# Patient Record
Sex: Female | Born: 1975 | Hispanic: Yes | Smoking: Former smoker
Health system: Southern US, Community
[De-identification: ages and names within clinical notes are randomized; demographics above are authoritative.]

## PROBLEM LIST (undated history)

## (undated) DIAGNOSIS — G43829 Menstrual migraine, not intractable, without status migrainosus: Secondary | ICD-10-CM

## (undated) DIAGNOSIS — N6019 Diffuse cystic mastopathy of unspecified breast: Secondary | ICD-10-CM

## (undated) DIAGNOSIS — F341 Dysthymic disorder: Secondary | ICD-10-CM

## (undated) DIAGNOSIS — Z30430 Encounter for insertion of intrauterine contraceptive device: Secondary | ICD-10-CM

## (undated) DIAGNOSIS — E559 Vitamin D deficiency, unspecified: Secondary | ICD-10-CM

## (undated) DIAGNOSIS — E039 Hypothyroidism, unspecified: Secondary | ICD-10-CM

## (undated) DIAGNOSIS — IMO0001 Reserved for inherently not codable concepts without codable children: Secondary | ICD-10-CM

## (undated) DIAGNOSIS — R635 Abnormal weight gain: Secondary | ICD-10-CM

## (undated) DIAGNOSIS — K5900 Constipation, unspecified: Secondary | ICD-10-CM

## (undated) DIAGNOSIS — R0602 Shortness of breath: Secondary | ICD-10-CM

## (undated) DIAGNOSIS — K219 Gastro-esophageal reflux disease without esophagitis: Secondary | ICD-10-CM

## (undated) DIAGNOSIS — J309 Allergic rhinitis, unspecified: Secondary | ICD-10-CM

## (undated) HISTORY — DX: Encounter for insertion of intrauterine contraceptive device: Z30.430

## (undated) HISTORY — DX: Allergic rhinitis, unspecified: J30.9

## (undated) HISTORY — DX: Hypothyroidism, unspecified: E03.9

## (undated) HISTORY — DX: Menstrual migraine, not intractable, without status migrainosus: G43.829

## (undated) HISTORY — DX: Vitamin D deficiency, unspecified: E55.9

## (undated) HISTORY — DX: Reserved for inherently not codable concepts without codable children: IMO0001

## (undated) HISTORY — DX: Dysthymic disorder: F34.1

## (undated) HISTORY — DX: Constipation, unspecified: K59.00

## (undated) HISTORY — DX: Abnormal weight gain: R63.5

## (undated) HISTORY — DX: Diffuse cystic mastopathy of unspecified breast: N60.19

## (undated) HISTORY — DX: Gastro-esophageal reflux disease without esophagitis: K21.9

## (undated) HISTORY — DX: Shortness of breath: R06.02

---

## 2012-01-15 DIAGNOSIS — J309 Allergic rhinitis, unspecified: Secondary | ICD-10-CM | POA: Insufficient documentation

## 2012-01-15 DIAGNOSIS — R0602 Shortness of breath: Secondary | ICD-10-CM

## 2012-01-15 DIAGNOSIS — R635 Abnormal weight gain: Secondary | ICD-10-CM

## 2012-01-15 DIAGNOSIS — G43829 Menstrual migraine, not intractable, without status migrainosus: Secondary | ICD-10-CM | POA: Insufficient documentation

## 2012-01-15 HISTORY — DX: Abnormal weight gain: R63.5

## 2012-01-15 HISTORY — DX: Shortness of breath: R06.02

## 2012-03-05 DIAGNOSIS — K5900 Constipation, unspecified: Secondary | ICD-10-CM

## 2012-03-05 HISTORY — DX: Constipation, unspecified: K59.00

## 2012-03-09 DIAGNOSIS — E559 Vitamin D deficiency, unspecified: Secondary | ICD-10-CM

## 2012-03-09 DIAGNOSIS — E034 Atrophy of thyroid (acquired): Secondary | ICD-10-CM | POA: Insufficient documentation

## 2012-03-09 HISTORY — DX: Vitamin D deficiency, unspecified: E55.9

## 2012-12-07 DIAGNOSIS — Z3009 Encounter for other general counseling and advice on contraception: Secondary | ICD-10-CM | POA: Insufficient documentation

## 2013-03-08 DIAGNOSIS — N6019 Diffuse cystic mastopathy of unspecified breast: Secondary | ICD-10-CM | POA: Insufficient documentation

## 2013-06-23 DIAGNOSIS — K219 Gastro-esophageal reflux disease without esophagitis: Secondary | ICD-10-CM | POA: Insufficient documentation

## 2014-10-12 DIAGNOSIS — F341 Dysthymic disorder: Secondary | ICD-10-CM | POA: Insufficient documentation

## 2014-11-03 HISTORY — PX: INTRAUTERINE DEVICE INSERTION: SHX323

## 2015-06-18 DIAGNOSIS — Z975 Presence of (intrauterine) contraceptive device: Secondary | ICD-10-CM | POA: Insufficient documentation

## 2015-08-08 ENCOUNTER — Ambulatory Visit: Payer: BLUE CROSS/BLUE SHIELD | Attending: Otolaryngology

## 2015-08-08 DIAGNOSIS — G4733 Obstructive sleep apnea (adult) (pediatric): Secondary | ICD-10-CM | POA: Diagnosis not present

## 2015-09-11 ENCOUNTER — Other Ambulatory Visit: Payer: Self-pay | Admitting: Bariatrics

## 2015-09-18 ENCOUNTER — Ambulatory Visit
Admission: RE | Admit: 2015-09-18 | Discharge: 2015-09-18 | Disposition: A | Discharge status: Home / Self Care | Payer: BLUE CROSS/BLUE SHIELD | Source: Ambulatory Visit | Attending: Bariatrics | Admitting: Bariatrics

## 2015-09-18 DIAGNOSIS — Z01818 Encounter for other preprocedural examination: Secondary | ICD-10-CM | POA: Insufficient documentation

## 2015-09-18 DIAGNOSIS — Z01810 Encounter for preprocedural cardiovascular examination: Secondary | ICD-10-CM | POA: Insufficient documentation

## 2015-10-03 ENCOUNTER — Ambulatory Visit: Payer: BLUE CROSS/BLUE SHIELD | Attending: Otolaryngology

## 2015-10-03 DIAGNOSIS — G4733 Obstructive sleep apnea (adult) (pediatric): Secondary | ICD-10-CM | POA: Diagnosis not present

## 2015-12-04 ENCOUNTER — Other Ambulatory Visit: Payer: Self-pay

## 2015-12-11 ENCOUNTER — Ambulatory Visit (INDEPENDENT_AMBULATORY_CARE_PROVIDER_SITE_OTHER): Payer: BLUE CROSS/BLUE SHIELD | Admitting: Cardiovascular Disease

## 2015-12-11 ENCOUNTER — Encounter: Payer: Self-pay | Admitting: Cardiovascular Disease

## 2015-12-11 ENCOUNTER — Encounter: Payer: Self-pay | Admitting: Nurse Practitioner

## 2015-12-11 VITALS — BP 102/60 | HR 78 | Ht 63.0 in | Wt 223.4 lb

## 2015-12-11 DIAGNOSIS — Z01818 Encounter for other preprocedural examination: Secondary | ICD-10-CM

## 2015-12-11 NOTE — Progress Notes (Signed)
Cardiology Office Note   Date:  12/11/2015   ID:  [place], DOB 1976-02-05, MRN [id number]  PCP:  Willow Ora, MD  Cardiologist:   Vesta Mixer, MD   Chief Complaint  Patient presents with  . Pre-op Exam   Problem List 1. Morbid obesity 2. Obstructive sleep apnea  3. Hypothyroidism     History of Present Illness: Rebecca Mclaughlin is a 40 y.o. female who presents for pre op evaluation prior to bariatric surgery   Healthy female, Works out regularly .   No CP or dyspnea. Can climb 3-4 flights of stairs without any problems Was running until 2-3 months ago - would walk 2 miles, run 2 miles.  Was working out regularly.  Was working out 6 times a week,  Now is working out 3 times a week.   Gained 25 lbs. Diet has been good -cooks all her food.     Past Medical History  Diagnosis Date  . Encounter for insertion of mirena IUD   . Dysthymia   . GERD (gastroesophageal reflux disease)   . Fibrocystic breast   . Hypothyroidism   . Allergic rhinitis   . Obesity, Class II, BMI 35.0-39.9, with comorbidity (see actual BMI) (HCC)   . Menstrual headache   . Vitamin D deficiency 5.7.13    resolved 2.4.14  . Constipation 5.3.13    2.4.14 resolved  . SOB (shortness of breath) 3.14.13    resolved 7.11.13  . Weight gain 3.14.13    resolved 7.11.13    Past Surgical History  Procedure Laterality Date  . Intrauterine device insertion  2016     Current Outpatient Prescriptions  Medication Sig Dispense Refill  . FLUoxetine (PROZAC) 20 MG capsule Take 20 mg by mouth daily.     . pantoprazole (PROTONIX) 40 MG tablet Take 40 mg by mouth daily.     Marland Kitchen albuterol (PROVENTIL HFA;VENTOLIN HFA) 108 (90 Base) MCG/ACT inhaler Inhale 2 puffs into the lungs every 4 (four) hours as needed for wheezing.     Marland Kitchen levothyroxine (SYNTHROID, LEVOTHROID) 100 MCG tablet TAKE ONE TABLET BY MOUTH ONCE DAILY    . montelukast (SINGULAIR) 10 MG tablet Take 10 mg by mouth at bedtime.     Marland Kitchen  omeprazole (PRILOSEC OTC) 20 MG tablet Take 20 mg by mouth daily.     Marland Kitchen omeprazole (PRILOSEC) 20 MG capsule Take 20 mg by mouth daily.    . polyethylene glycol (MIRALAX / GLYCOLAX) packet Take 17 g by mouth daily.      No current facility-administered medications for this visit.    Allergies:   Review of patient's allergies indicates no known allergies.    Social History:  The patient  reports that she has quit smoking. She does not have any smokeless tobacco history on file. She reports that she drinks alcohol.   Family History:  The patient's family history includes Diabetes Mellitus II in her father and maternal uncle; Diverticulitis in her mother; Hypertension in her father.    ROS:  Please see the history of present illness.    Review of Systems: Constitutional:  denies fever, chills, diaphoresis, appetite change and fatigue.  HEENT: denies photophobia, eye pain, redness, hearing loss, ear pain, congestion, sore throat, rhinorrhea, sneezing, neck pain, neck stiffness and tinnitus.  Respiratory: denies SOB, DOE, cough, chest tightness, and wheezing.  Cardiovascular: denies chest pain, palpitations and leg swelling.  Gastrointestinal: denies nausea, vomiting, abdominal pain, diarrhea, constipation, blood in stool.  Genitourinary: denies dysuria,  urgency, frequency, hematuria, flank pain and difficulty urinating.  Musculoskeletal: denies  myalgias, back pain, joint swelling, arthralgias and gait problem.   Skin: denies pallor, rash and wound.  Neurological: denies dizziness, seizures, syncope, weakness, light-headedness, numbness and headaches.   Hematological: denies adenopathy, easy bruising, personal or family bleeding history.  Psychiatric/ Behavioral: denies suicidal ideation, mood changes, confusion, nervousness, sleep disturbance and agitation.       All other systems are reviewed and negative.    PHYSICAL EXAM: VS:  Ht 5\' 3"  (1.6 m)  Wt 223 hed, well developed, in no acute distress HEENT: normal Neck: no JVD, carotid bruits, or masses Cardiac: RRR; no murmurs, rubs, or gallops,no edema  Respiratory:  clear to auscultation bilaterally, normal work of breathing GI: soft, nontender, nondistended, + BS MS: no deformity or atrophy Skin: warm and dry, no rash Neuro:  Strength and sensation are intact Psych: normal   EKG:  EKG is ordered today. The ekg ordered today demonstrates  NSR at 77.   No ST or T wave changes   Recent Labs: No results found for requested labs within last 365 days.    Lipid Panel No results found for: CHOL, TRIG, HDL, CHOLHDL, VLDL, LDLCALC, LDLDIRECT    Wt Readings from Last 3 Encounters:  12/11/15 223 lb 6.4 oz (101.334 kg)      Other studies Reviewed: Additional studies/ records that were reviewed today include: . Review of the above records demonstrates:    ASSESSMENT AND PLAN:  1.  Morbid obesity:   She is here for pre-op eval. She works out regularly without any problems.   She has no cardiac limitations. She is at low risk for cardiac complication with her bariatric surgery . I've advised her to make sure that she has done all that she can to lose weight before she commits to the surgery .    She is satisfied with her attempts at weight loss and is ready to have the surgery .    I will see her on an as needed basis .    Current medicines are reviewed at length with the patient today.  The patient does not have concerns regarding medicines.  The following changes have been made:  no change  Labs/ tests ordered today include:  No orders of the defined types were placed in this encounter.     Disposition:   FU with me as needed.        Nahser, Deloris Ping, MD  12/11/2015 7:47 AM    [facility] [address] [place], [place], [place]  [address] Phone: [phone]; Fax: [phone]    [facility]  [address] [place], [place]  [address] [phone]   Fax [phone]

## 2015-12-11 NOTE — Patient Instructions (Signed)
Medication Instructions:  Your physician recommends that you continue on your current medications as directed. Please refer to the Current Medication list given to you today.   Labwork: None Ordered   Testing/Procedures: None Ordered   Follow-Up: Your physician recommends that you schedule a follow-up appointment in: as needed with Dr. Nahser   If you need a refill on your cardiac medications before your next appointment, please call your pharmacy.   Thank you for choosing [facility]! Chrisie Jankovich, RN [phone]    

## 2016-01-02 IMAGING — RF DG UGI W/O KUB
10 of 14 series · 13 of 24 positions shown · non-contrast
Comparison: None.

CLINICAL DATA: Preop bariatric surgery

EXAM:
UPPER GI SERIES WITHOUT KUB
TECHNIQUE: Routine upper GI series was performed with thin and thick barium.
FLUOROSCOPY TIME:  Radiation Exposure Index (as provided by the
fluoroscopic device): 9.5 mGy

[Series 1: cp_standard · 0.25mm/px · 1 of 1 slices shown (1 of 10)]
[im 1/1]
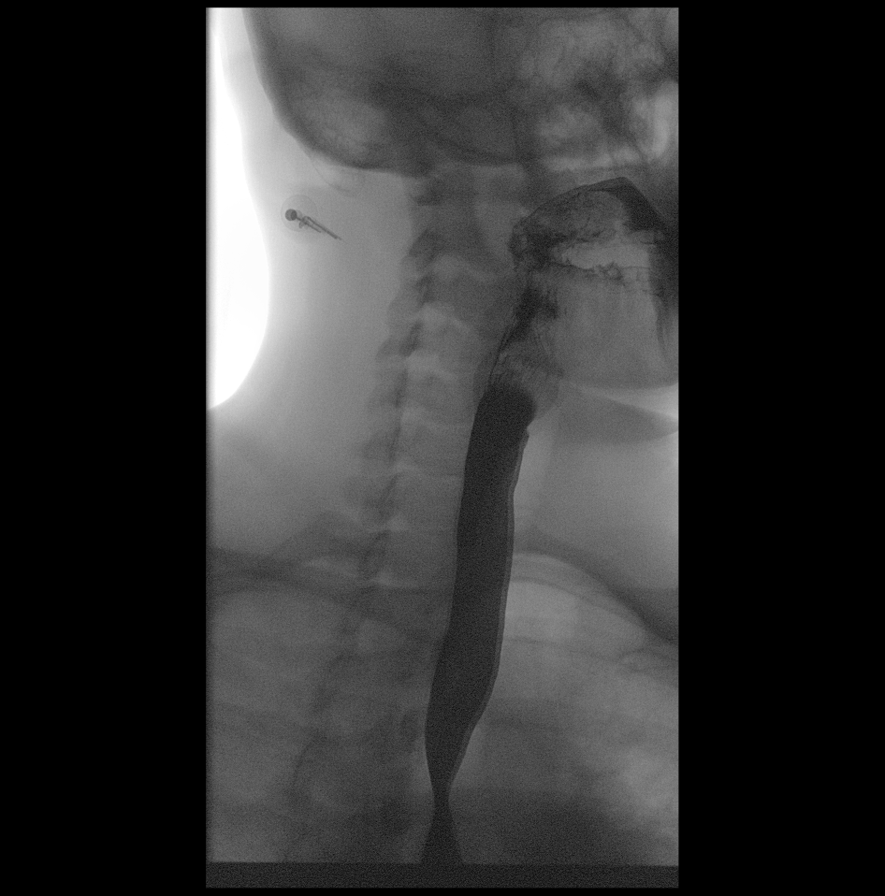

[Series 3: cp_standard · 0.51mm/px · 1 of 4 frames shown (2 of 10)]
[frame 3/4]
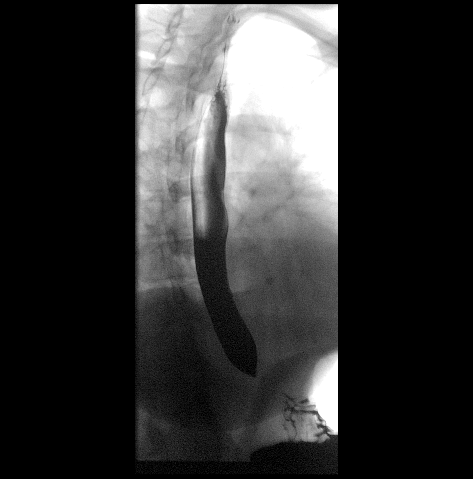

[Series 4: cp_standard · 0.51mm/px · 2 of 13 frames shown (3 of 10)]
[frame 2/13]
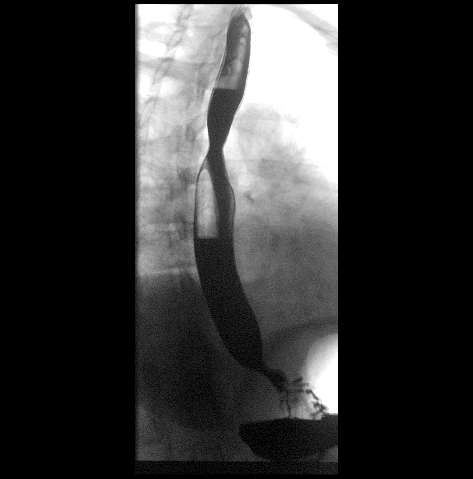
[frame 13/13]
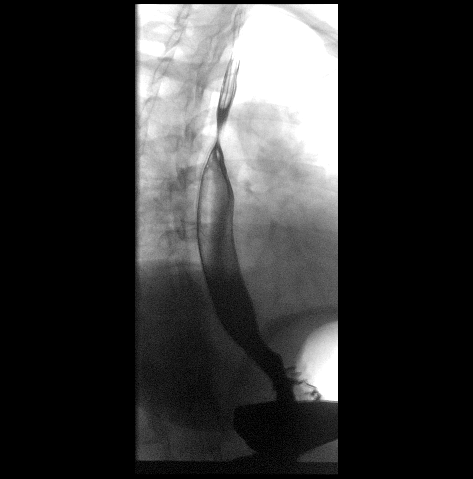

[Series 6: cp_standard · 0.28mm/px · 1 of 1 slices shown (4 of 10)]
[im 1/1]
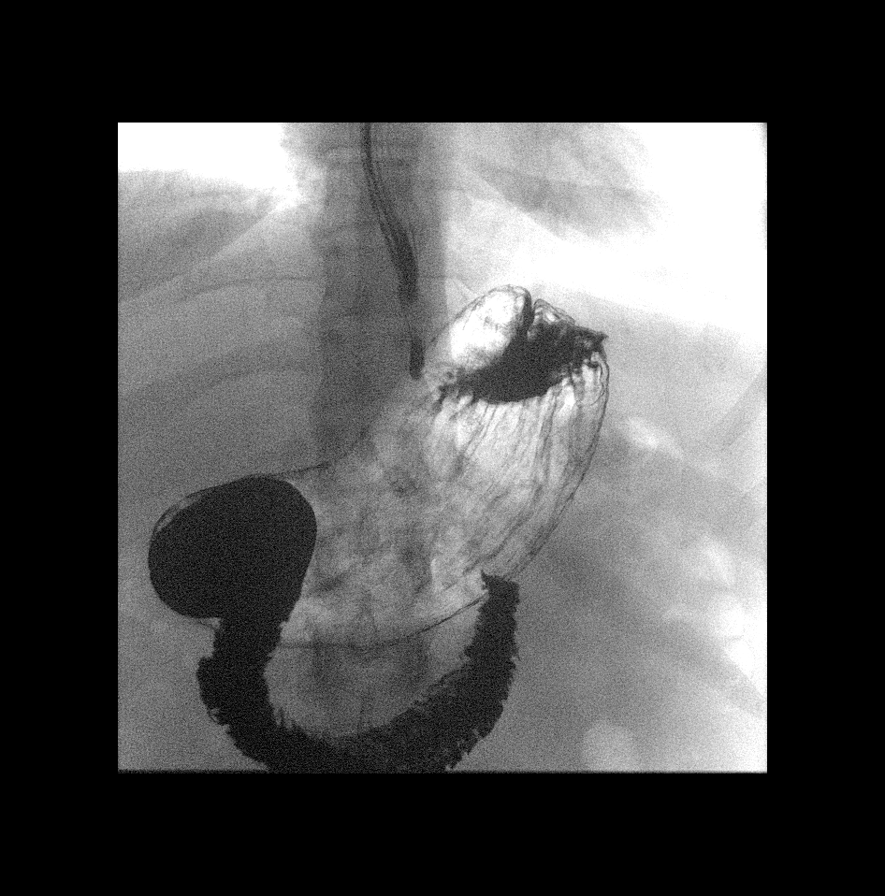

[Series 9: cp_standard · 0.28mm/px · 1 of 1 slices shown (5 of 10)]
[im 1/1]
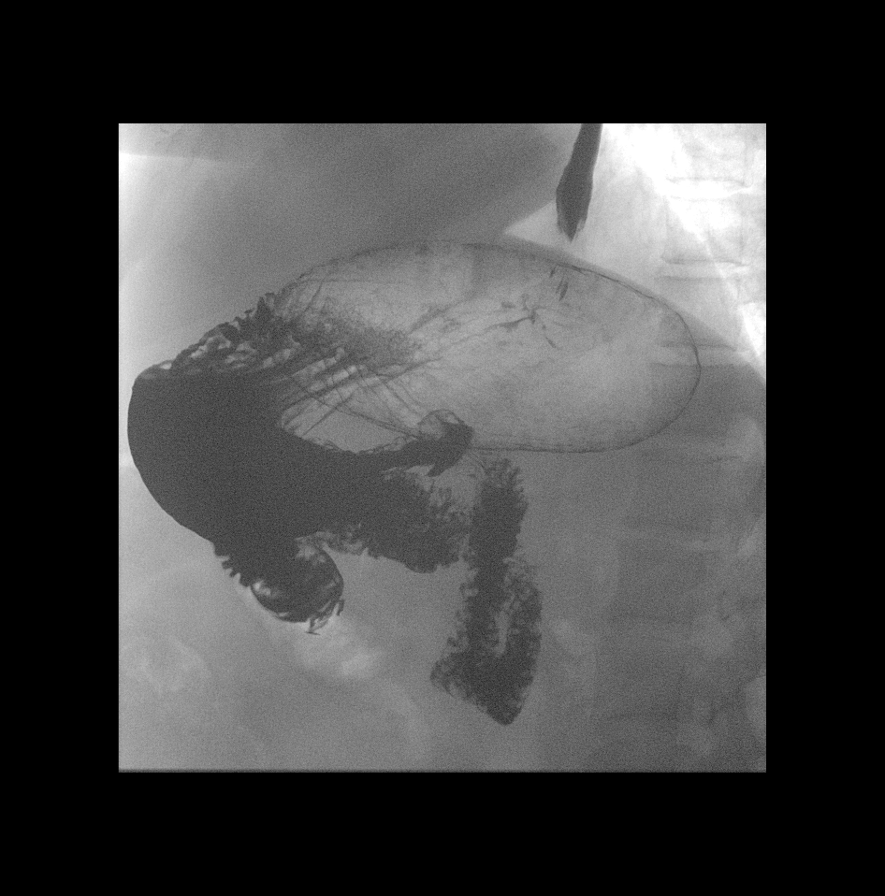

[Series 12: cp_standard · 0.57mm/px · 2 of 9 frames shown (6 of 10)]
[frame 2/9]
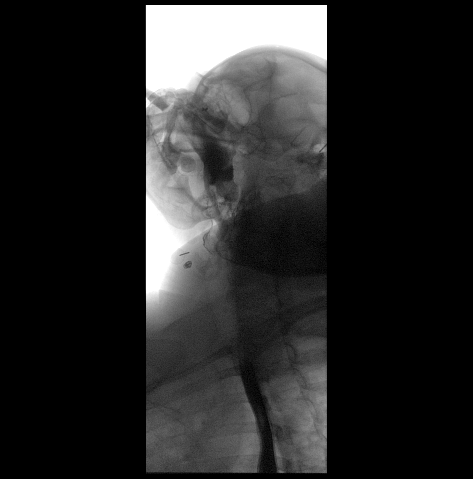
[frame 5/9]
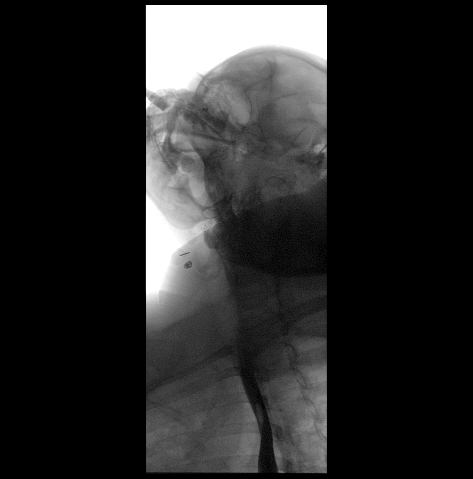

[Series 13: cp_standard · 0.57mm/px · 2 of 5 frames shown (7 of 10)]
[frame 1/5]
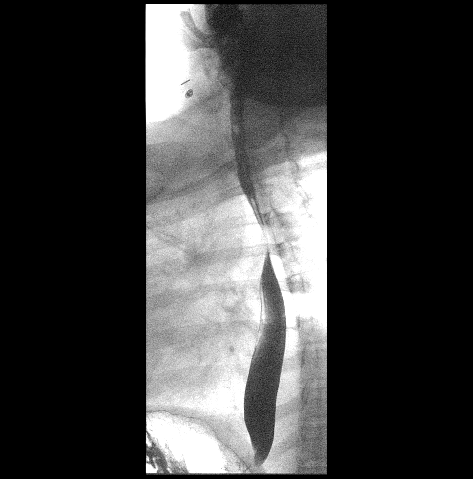
[frame 5/5]
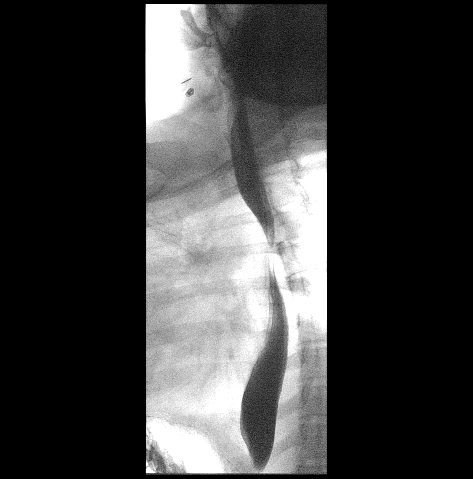

[Series 14: cp_standard · 0.57mm/px · 1 of 6 frames shown (8 of 10)]
[frame 6/6]
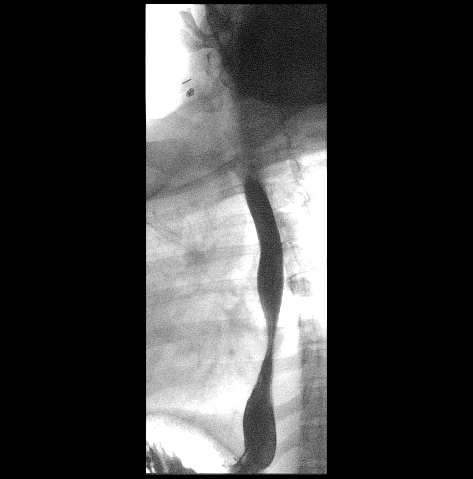

[Series 15: cp_standard · 0.57mm/px · 1 of 10 frames shown (9 of 10)]
[frame 4/10]
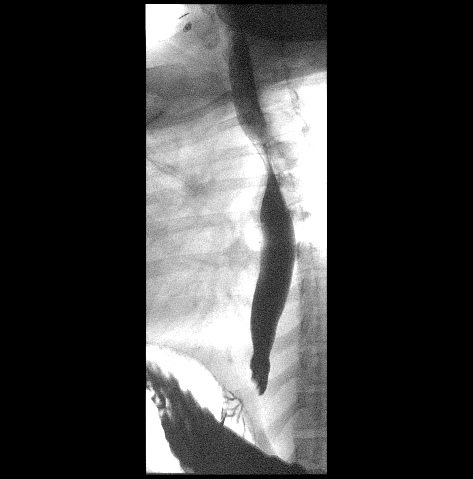

[Series 16: cp_standard · 0.28mm/px · 1 of 1 slices shown (10 of 10)]
[im 1/1]
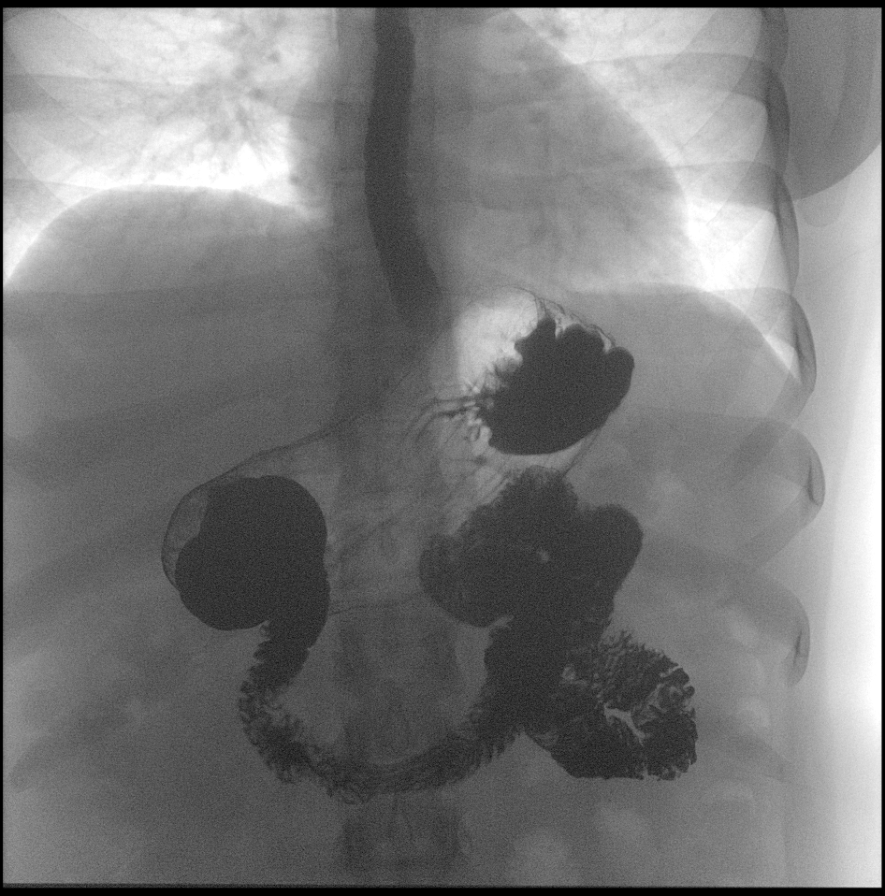

[13 of 24 positions shown; findings below may reference images not displayed]

FINDINGS: Examination of the esophagus demonstrated normal esophageal
motility. Normal esophageal morphology without evidence of
esophagitis or ulceration. No esophageal stricture, diverticula, or
mass lesion. No evidence of hiatal hernia. There is moderate
gastroesophageal reflux.

Examination of the stomach demonstrated normal rugal folds and areae
gastricae. The gastric mucosa appeared unremarkable without evidence
of ulceration, scarring, or mass lesion. Gastric motility and
emptying was normal. Fluoroscopic examination of the duodenum
demonstrates normal motility and morphology without evidence of
ulceration or mass lesion.
IMPRESSION: 1. Moderate gastroesophageal reflux.
2. Otherwise normal upper GI.

## 2016-07-11 ENCOUNTER — Other Ambulatory Visit (HOSPITAL_COMMUNITY): Payer: Self-pay | Admitting: Bariatrics

## 2016-07-11 DIAGNOSIS — R1310 Dysphagia, unspecified: Secondary | ICD-10-CM

## 2016-07-18 ENCOUNTER — Ambulatory Visit
Admission: RE | Admit: 2016-07-18 | Discharge: 2016-07-18 | Disposition: A | Discharge status: Home / Self Care | Payer: BLUE CROSS/BLUE SHIELD | Source: Ambulatory Visit | Attending: Bariatrics | Admitting: Bariatrics

## 2016-07-18 DIAGNOSIS — R1310 Dysphagia, unspecified: Secondary | ICD-10-CM

## 2016-07-18 DIAGNOSIS — Z9884 Bariatric surgery status: Secondary | ICD-10-CM | POA: Insufficient documentation

## 2018-12-11 ENCOUNTER — Other Ambulatory Visit: Payer: Self-pay | Admitting: Physician Assistant

## 2018-12-11 DIAGNOSIS — H9041 Sensorineural hearing loss, unilateral, right ear, with unrestricted hearing on the contralateral side: Secondary | ICD-10-CM

## 2018-12-11 DIAGNOSIS — IMO0001 Reserved for inherently not codable concepts without codable children: Secondary | ICD-10-CM

## 2018-12-20 ENCOUNTER — Ambulatory Visit
Admission: RE | Admit: 2018-12-20 | Discharge: 2018-12-20 | Disposition: A | Discharge status: Home / Self Care | Payer: BLUE CROSS/BLUE SHIELD | Source: Ambulatory Visit | Attending: Physician Assistant | Admitting: Physician Assistant

## 2018-12-20 DIAGNOSIS — IMO0001 Reserved for inherently not codable concepts without codable children: Secondary | ICD-10-CM

## 2018-12-20 DIAGNOSIS — H9041 Sensorineural hearing loss, unilateral, right ear, with unrestricted hearing on the contralateral side: Secondary | ICD-10-CM

## 2018-12-20 MED ORDER — GADOBENATE DIMEGLUMINE 529 MG/ML IV SOLN
15.0000 mL | Freq: Once | INTRAVENOUS | Status: AC | PRN
  Administered 2018-12-20: 15 mL via INTRAVENOUS
